# Patient Record
Sex: Female | Born: 2000 | Race: White | Hispanic: No | Marital: Single | Smoking: Current some day smoker
Health system: Southern US, Community
[De-identification: ages and names within clinical notes are randomized; demographics above are authoritative.]

## PROBLEM LIST (undated history)

## (undated) DIAGNOSIS — Q2730 Arteriovenous malformation, site unspecified: Secondary | ICD-10-CM

## (undated) HISTORY — PX: KIDNEY SURGERY: SHX687

---

## 2018-05-19 ENCOUNTER — Other Ambulatory Visit: Payer: Self-pay

## 2018-05-19 ENCOUNTER — Emergency Department (HOSPITAL_COMMUNITY)
Admission: EM | Admit: 2018-05-19 | Discharge: 2018-05-19 | Disposition: A | Discharge status: Home / Self Care | Payer: Self-pay | Attending: Emergency Medicine | Admitting: Emergency Medicine

## 2018-05-19 ENCOUNTER — Emergency Department (HOSPITAL_COMMUNITY): Payer: Self-pay

## 2018-05-19 ENCOUNTER — Encounter (HOSPITAL_COMMUNITY): Payer: Self-pay | Admitting: Emergency Medicine

## 2018-05-19 DIAGNOSIS — R0789 Other chest pain: Secondary | ICD-10-CM | POA: Insufficient documentation

## 2018-05-19 DIAGNOSIS — F17200 Nicotine dependence, unspecified, uncomplicated: Secondary | ICD-10-CM | POA: Insufficient documentation

## 2018-05-19 HISTORY — DX: Arteriovenous malformation, site unspecified: Q27.30

## 2018-05-19 LAB — CBC
HCT: 42.3 % (ref 36.0–46.0)
Hemoglobin: 13.7 g/dL (ref 12.0–15.0)
MCH: 29.3 pg (ref 26.0–34.0)
MCHC: 32.4 g/dL (ref 30.0–36.0)
MCV: 90.6 fL (ref 80.0–100.0)
PLATELETS: 249 10*3/uL (ref 150–400)
RBC: 4.67 MIL/uL (ref 3.87–5.11)
RDW: 12.3 % (ref 11.5–15.5)
WBC: 5.3 10*3/uL (ref 4.0–10.5)
nRBC: 0 % (ref 0.0–0.2)

## 2018-05-19 LAB — BASIC METABOLIC PANEL
ANION GAP: 7 (ref 5–15)
BUN: 12 mg/dL (ref 6–20)
CO2: 26 mmol/L (ref 22–32)
Calcium: 9.5 mg/dL (ref 8.9–10.3)
Chloride: 105 mmol/L (ref 98–111)
Creatinine, Ser: 0.81 mg/dL (ref 0.44–1.00)
GFR calc Af Amer: >60 mL/min (ref >60)
GLUCOSE: 88 mg/dL (ref 70–99)
Potassium: 3.9 mmol/L (ref 3.5–5.1)
Sodium: 138 mmol/L (ref 135–145)

## 2018-05-19 LAB — I-STAT TROPONIN, ED: TROPONIN I, POC: 0 ng/mL (ref 0.00–0.08)

## 2018-05-19 LAB — I-STAT BETA HCG BLOOD, ED (MC, WL, AP ONLY): I-stat hCG, quantitative: <5 m[IU]/mL (ref <5)

## 2018-05-19 MED ORDER — SODIUM CHLORIDE 0.9% FLUSH: 3.0000 mL | Freq: Once | INTRAVENOUS | Status: DC

## 2018-05-19 NOTE — ED Triage Notes (Signed)
Pt presents with multiple complaints. C/o sudden onset chest pain that started last night with shortness of breath that started today. Reports left knee pain and feeling like her knee will "give out". C/o white spots on throat, no pain in throat.

## 2018-05-19 NOTE — ED Notes (Signed)
Pt just came in and states that she had checked in and left to go to car.  Pt undischarged.

## 2018-05-19 NOTE — ED Notes (Signed)
ED Provider at bedside. 

## 2018-05-19 NOTE — ED Notes (Signed)
Pt reports that she has been under a lot of stress. States that her mom recently lost her job and she has been working to support both of them. Denies SI/HI.

## 2018-05-19 NOTE — ED Notes (Signed)
Pt back from X-ray.  

## 2018-05-19 NOTE — ED Provider Notes (Signed)
[facility] EMERGENCY DEPARTMENT Provider Note   CSN: [id number] Arrival date & time: 05/19/18  1505     History   Chief Complaint Chief Complaint  Patient presents with  . Chest Pain  . Shortness of Breath    HPI Brittney Day is a 18 y.o. female.  Patient is a 18 year old female who presents with chest pain.  She said that she had a sharp pain in her chest last night but that went away and now she has a dull aching in the center of her chest.  She feels like it is a little uncomfortable to take a deep breath but the pain is not really worse when she takes a deep breath.  She feels a little short of breath at times.  She has no history of heart problems.  No history of similar pain.  She states she does drink a lot of red bowls at work and sometimes she feels like her heart is racing.  She denies any dizziness.  She does report some pain to the back of her left knee.  She denies any leg swelling.  She feels like the knee slides in and out of joint from time to time but denies any known swelling of the knee or the leg.  She denies any tobacco use.  She does not take oral contraceptives.  No recent immobilization.  No history of DVT.     Past Medical History:  Diagnosis Date  . AV malformation, peripheral, congenital     There are no active problems to display for this patient.    The histories are not reviewed yet. Please review them in the "History" navigator section and refresh this SmartLink.   OB History   No obstetric history on file.      Home Medications    Prior to Admission medications   Not on File    Family History No family history on file.  Social History Social History   Tobacco Use  . Smoking status: Current Some Day Smoker  . Smokeless tobacco: Never Used  Substance Use Topics  . Alcohol use: Never    Frequency: Never  . Drug use: Never     Allergies   Patient has no known allergies.   Review of  Systems Review of Systems  Constitutional: Negative for chills, diaphoresis, fatigue and fever.  HENT: Negative for congestion, rhinorrhea and sneezing.   Eyes: Negative.   Respiratory: Positive for shortness of breath. Negative for cough and chest tightness.   Cardiovascular: Positive for chest pain. Negative for leg swelling.  Gastrointestinal: Negative for abdominal pain, blood in stool, diarrhea, nausea and vomiting.  Genitourinary: Negative for difficulty urinating, flank pain, frequency and hematuria.  Musculoskeletal: Positive for arthralgias. Negative for back pain.  Skin: Negative for rash.  Neurological: Negative for dizziness, speech difficulty, weakness, numbness and headaches.     Physical Exam Updated Vital Signs BP 138/88   Pulse 99   Temp 98.9 F (37.2 C) (Oral)   Resp 16   Ht 5\' 4"  (1.626 m)   Wt 54.4 kg   LMP 05/15/2018   SpO2 100%   BMI 20.60 kg/m   Physical Exam Constitutional:      Appearance: She is well-developed.  HENT:     Head: Normocephalic and atraumatic.  Eyes:     Pupils: Pupils are equal, round, and reactive to light.  Neck:     Musculoskeletal: Normal range of motion and neck supple.  Cardiovascular:  Rate and Rhythm: Normal rate and regular rhythm.     Heart sounds: Normal heart sounds.  Pulmonary:     Effort: Pulmonary effort is normal. No respiratory distress.     Breath sounds: Normal breath sounds. No wheezing or rales.  Chest:     Chest wall: No tenderness.  Abdominal:     General: Bowel sounds are normal.     Palpations: Abdomen is soft.     Tenderness: There is no abdominal tenderness. There is no guarding or rebound.  Musculoskeletal: Normal range of motion.     Comments: Patient has a tiny punctate abrasion to the posterior aspect of her left knee.  There is no swelling to the leg or the knee.  No gross instability.  No bony tenderness.  She does have a little discomfort on palpation of the posterior knee and upper calf.   Lymphadenopathy:     Cervical: No cervical adenopathy.  Skin:    General: Skin is warm and dry.     Findings: No rash.  Neurological:     Mental Status: She is alert and oriented to person, place, and time.      ED Treatments / Results  Labs (all labs ordered are listed, but only abnormal results are displayed) Labs Reviewed  BASIC METABOLIC PANEL  CBC  I-STAT TROPONIN, ED  I-STAT BETA HCG BLOOD, ED ([facility], [facility], [facility] ONLY)    EKG EKG Interpretation  Date/Time:  Sunday May 19 2018 15:57:31 EST Ventricular Rate:  84 PR Interval:  132 QRS Duration: 84 QT Interval:  354 QTC Calculation: 418 R Axis:   76 Text Interpretation:  Normal sinus rhythm with sinus arrhythmia Normal ECG No old tracing to compare Confirmed by Rolan BuccoBelfi, Jillianne Gamino [phone]([id number]) on 05/19/2018 4:25:19 PM   Radiology Dg Chest 2 View  Result Date: 05/19/2018 CLINICAL DATA:  C/o sudden onset chest pain that started last night with shortness of breath that started today. Smoker, and anxiety EXAM: CHEST - 2 VIEW COMPARISON:  None. FINDINGS: Heart size and mediastinal contours are within normal limits. Lungs are clear. Lung volumes are normal. No pleural effusion or pneumothorax seen. Dextroscoliosis of the thoracolumbar spine, mild to moderate in degree. No acute appearing osseous abnormality. IMPRESSION: No active cardiopulmonary disease. No evidence of pneumonia or pulmonary edema. Electronically Signed   By: Bary RichardStan  Maynard M.D.   On: 05/19/2018 17:07    Procedures Procedures (including critical care time)  Medications Ordered in ED Medications  sodium chloride flush (NS) 0.9 % injection 3 mL (3 mLs Intravenous Not Given 05/19/18 1655)     Initial Impression / Assessment and Plan / ED Course  I have reviewed the triage vital signs and the nursing notes.  Pertinent labs & imaging results that were available during my care of the patient were reviewed by me and considered in my medical decision making (see chart for  details).     Patient presents with chest pain.  Her EKG does not show any ischemic changes.  She had labs drawn in triage which show no acute abnormalities.  Her troponin is negative.  Chest x-ray does not show any acute abnormality.  She does have some posterior calf pain and although I feel she has a low likelihood of having a DVT/PE, I did discuss doing a d-dimer with her.  She states that at this point she cannot stay any longer for any further assessment and has to leave to go to work.  I discussed risk of leaving.  Return precautions were given.  Final Clinical Impressions(s) / ED Diagnoses   Final diagnoses:  Atypical chest pain    ED Discharge Orders    None       Rolan Bucco, MD 05/19/18 1727

## 2018-05-19 NOTE — ED Notes (Signed)
Patient transported to X-ray 

## 2018-05-19 NOTE — ED Notes (Signed)
Pt stating she must leave for work, unable to stay for discharge vitals. Patient verbalizes understanding of discharge instructions. Opportunity for questioning and answers were provided. Armband removed by staff, pt discharged from ED. Ambulatory to lob by.

## 2019-05-07 ENCOUNTER — Other Ambulatory Visit: Payer: Self-pay

## 2019-05-07 ENCOUNTER — Encounter (HOSPITAL_COMMUNITY): Payer: Self-pay | Admitting: Emergency Medicine

## 2019-05-07 ENCOUNTER — Emergency Department (HOSPITAL_COMMUNITY)
Admission: EM | Admit: 2019-05-07 | Discharge: 2019-05-07 | Disposition: A | Discharge status: Home / Self Care | Payer: PRIVATE HEALTH INSURANCE | Attending: Emergency Medicine | Admitting: Emergency Medicine

## 2019-05-07 DIAGNOSIS — R300 Dysuria: Secondary | ICD-10-CM | POA: Diagnosis present

## 2019-05-07 DIAGNOSIS — N760 Acute vaginitis: Secondary | ICD-10-CM | POA: Insufficient documentation

## 2019-05-07 DIAGNOSIS — B9689 Other specified bacterial agents as the cause of diseases classified elsewhere: Secondary | ICD-10-CM | POA: Diagnosis not present

## 2019-05-07 DIAGNOSIS — N3001 Acute cystitis with hematuria: Secondary | ICD-10-CM | POA: Diagnosis not present

## 2019-05-07 DIAGNOSIS — F17210 Nicotine dependence, cigarettes, uncomplicated: Secondary | ICD-10-CM | POA: Diagnosis not present

## 2019-05-07 LAB — URINALYSIS, ROUTINE W REFLEX MICROSCOPIC
Bilirubin Urine: NEGATIVE
Glucose, UA: NEGATIVE mg/dL
Hgb urine dipstick: NEGATIVE
Ketones, ur: 20 mg/dL — AB
Nitrite: NEGATIVE
Protein, ur: NEGATIVE mg/dL
Specific Gravity, Urine: 1.025 (ref 1.005–1.030)
pH: 5 (ref 5.0–8.0)

## 2019-05-07 LAB — WET PREP, GENITAL
Sperm: NONE SEEN
Trich, Wet Prep: NONE SEEN
Yeast Wet Prep HPF POC: NONE SEEN

## 2019-05-07 LAB — PREGNANCY, URINE: Preg Test, Ur: NEGATIVE

## 2019-05-07 MED ORDER — CEPHALEXIN 500 MG PO CAPS: 500.0000 mg | ORAL_CAPSULE | Freq: Two times a day (BID) | ORAL | 0 refills | Status: AC

## 2019-05-07 MED ORDER — METRONIDAZOLE 500 MG PO TABS: 500.0000 mg | ORAL_TABLET | Freq: Two times a day (BID) | ORAL | 0 refills | Status: AC

## 2019-05-07 MED ORDER — CEPHALEXIN 500 MG PO CAPS
500.0000 mg | ORAL_CAPSULE | Freq: Once | ORAL | Status: AC
  Administered 2019-05-07: 500 mg via ORAL
  Filled 2019-05-07: qty 1

## 2019-05-07 MED ORDER — METRONIDAZOLE 500 MG PO TABS
500.0000 mg | ORAL_TABLET | Freq: Once | ORAL | Status: AC
  Administered 2019-05-07: 21:00:00 500 mg via ORAL
  Filled 2019-05-07: qty 1

## 2019-05-07 NOTE — Discharge Instructions (Addendum)
You were seen today for vaginal discharge and urinary symptoms.  It appears you have a urinary tract infection as well as vaginosis.  Please see the attached pamphlet for further information.  We have prescribed you 2 medications for this.  If you have any new or worsening symptoms please return to the emergency department.  Your gonorrhea and Chlamydia tests are pending.  Please avoid sexual intercourse until you know your results.  If your results are positive you will need to return for further treatment. Thank you for allowing me to care for you today. Take your medications as instructed.

## 2019-05-07 NOTE — ED Provider Notes (Signed)
[facility] EMERGENCY DEPARTMENT Provider Note   CSN: [id number] Arrival date & time: 05/07/19  1956     History Chief Complaint  Patient presents with  . Dysuria    Brittney Day is a 19 y.o. female.  Patient is a 19 year old female with no contributory past medical history presenting to the emergency department for dysuria and vaginal discharge for the last 2 to 3 days.  Patient reports that she has had a monogamous relationship with her boyfriend.  Reports that she has been having burning with urination and foul-smelling urine as well as a new vaginal discharge.  She denies any fever, chills, nausea, vomiting, diarrhea.  Reports she had her last menstrual cycle which is 2 weeks ago and normal.        Past Medical History:  Diagnosis Date  . AV malformation, peripheral, congenital     There are no problems to display for this patient.   Past Surgical History:  Procedure Laterality Date  . KIDNEY SURGERY       OB History   No obstetric history on file.     No family history on file.  Social History   Tobacco Use  . Smoking status: Current Some Day Smoker  . Smokeless tobacco: Never Used  Substance Use Topics  . Alcohol use: Never  . Drug use: Never    Home Medications Prior to Admission medications   Medication Sig Start Date End Date Taking? Authorizing Provider  cephALEXin (KEFLEX) 500 MG capsule Take 1 capsule (500 mg total) by mouth 2 (two) times daily for 7 days. 05/07/19 05/14/19  Arlyn Dunning, PA-C  metroNIDAZOLE (FLAGYL) 500 MG tablet Take 1 tablet (500 mg total) by mouth 2 (two) times daily. 05/07/19   Arlyn Dunning, PA-C    Allergies    Patient has no known allergies.  Review of Systems   Review of Systems  Constitutional: Negative for chills and fever.  HENT: Negative for congestion and sore throat.   Respiratory: Negative for cough and shortness of breath.   Gastrointestinal: Negative for abdominal pain, nausea and vomiting.   Genitourinary: Positive for dysuria, urgency and vaginal discharge. Negative for flank pain, hematuria, pelvic pain, vaginal bleeding and vaginal pain.  Musculoskeletal: Negative for back pain and myalgias.  Neurological: Negative for dizziness.    Physical Exam Updated Vital Signs BP 131/82 (BP Location: Right Arm)   Pulse 83   Temp 98.4 F (36.9 C) (Oral)   Resp 18   Ht 5\' 4"  (1.626 m)   Wt 56.7 kg   LMP 04/20/2019 (Approximate)   SpO2 99%   BMI 21.46 kg/m   Physical Exam Vitals and nursing note reviewed. Exam conducted with a chaperone present.  Constitutional:      General: She is not in acute distress.    Appearance: Normal appearance. She is not ill-appearing, toxic-appearing or diaphoretic.  HENT:     Head: Normocephalic.     Mouth/Throat:     Mouth: Mucous membranes are moist.  Eyes:     Conjunctiva/sclera: Conjunctivae normal.  Pulmonary:     Effort: Pulmonary effort is normal.  Genitourinary:    General: Normal vulva.     Vagina: Vaginal discharge (white) present. No erythema, tenderness or bleeding.     Cervix: Normal.     Uterus: Normal.      Adnexa:        Right: No mass, tenderness or fullness.         Left: No mass,  tenderness or fullness.    Skin:    General: Skin is dry.  Neurological:     Mental Status: She is alert.  Psychiatric:        Mood and Affect: Mood normal.     ED Results / Procedures / Treatments   Labs (all labs ordered are listed, but only abnormal results are displayed) Labs Reviewed  WET PREP, GENITAL - Abnormal; Notable for the following components:      Result Value   Clue Cells Wet Prep HPF POC PRESENT (*)    WBC, Wet Prep HPF POC RARE (*)    All other components within normal limits  URINALYSIS, ROUTINE W REFLEX MICROSCOPIC - Abnormal; Notable for the following components:   APPearance HAZY (*)    Ketones, ur 20 (*)    Leukocytes,Ua MODERATE (*)    Bacteria, UA RARE (*)    All other components within normal limits   PREGNANCY, URINE  GC/CHLAMYDIA PROBE AMP ([place]) NOT AT [facility]    EKG None  Radiology No results found.  Procedures Procedures (including critical care time)  Medications Ordered in ED Medications  metroNIDAZOLE (FLAGYL) tablet 500 mg (has no administration in time range)  cephALEXin (KEFLEX) capsule 500 mg (has no administration in time range)    ED Course  I have reviewed the triage vital signs and the nursing notes.  Pertinent labs & imaging results that were available during my care of the patient were reviewed by me and considered in my medical decision making (see chart for details).  Clinical Course as of May 06 2117  Wed May 07, 2019  2043 Patient with vaginal discharge, odor and dysuria. No signs of PID on exam. No known STD exposure.    [KM]  2116 Patient with positive leukocytes, white blood cells, red blood cells and bacteria on urinalysis.  Patient does have clue cells present on wet prep as well.  Discussed this with the patient.  We will treat her with Keflex for UTI as well as Flagyl for vaginosis.  Discussed options for being treated for gonorrhea and chlamydia.  Patient declined at this time and will follow up with her lab results.  Advised on return precautions.   [KM]    Clinical Course User Index [KM] Jeral Pinch   MDM Rules/Calculators/A&P                      Based on review of vitals, medical screening exam, lab work and/or imaging, there does not appear to be an acute, emergent etiology for the patient's symptoms. Counseled pt on good return precautions and encouraged both PCP and ED follow-up as needed.  Prior to discharge, I also discussed incidental imaging findings with patient in detail and advised appropriate, recommended follow-up in detail.  Clinical Impression: 1. BV (bacterial vaginosis)   2. Acute cystitis with hematuria     Disposition: Discharge  Prior to providing a prescription for a controlled substance, I  independently reviewed the patient's recent prescription history on the [place] Controlled Substance Reporting System. The patient had no recent or regular prescriptions and was deemed appropriate for a brief, less than 3 day prescription of narcotic for acute analgesia.  This note was prepared with assistance of Conservation officer, historic buildings. Occasional wrong-word or sound-a-like substitutions may have occurred due to the inherent limitations of voice recognition software.  Final Clinical Impression(s) / ED Diagnoses Final diagnoses:  BV (bacterial vaginosis)  Acute cystitis with  hematuria    Rx / DC Orders ED Discharge Orders         Ordered    metroNIDAZOLE (FLAGYL) 500 MG tablet  2 times daily     05/07/19 2119    cephALEXin (KEFLEX) 500 MG capsule  2 times daily     05/07/19 2119           Kristine Royal 05/07/19 2119    Fredia Sorrow, MD 05/11/19 0830

## 2019-05-07 NOTE — ED Triage Notes (Signed)
Pt with c/o urinary frequency, burning with urination, urinary odor, vaginal odor, and vaginal discharge that it white in color x 3 days.

## 2019-05-09 LAB — GC/CHLAMYDIA PROBE AMP (~~LOC~~) NOT AT ARMC
Chlamydia: NEGATIVE
Neisseria Gonorrhea: NEGATIVE

## 2020-06-25 IMAGING — CR DG CHEST 2V
2 series · 2 of 2 positions shown · non-contrast
Comparison: None.

CLINICAL DATA: C/o sudden onset chest pain that started last night
with shortness of breath that started today. Smoker, and anxiety

EXAM:
CHEST - 2 VIEW

[chest pa]
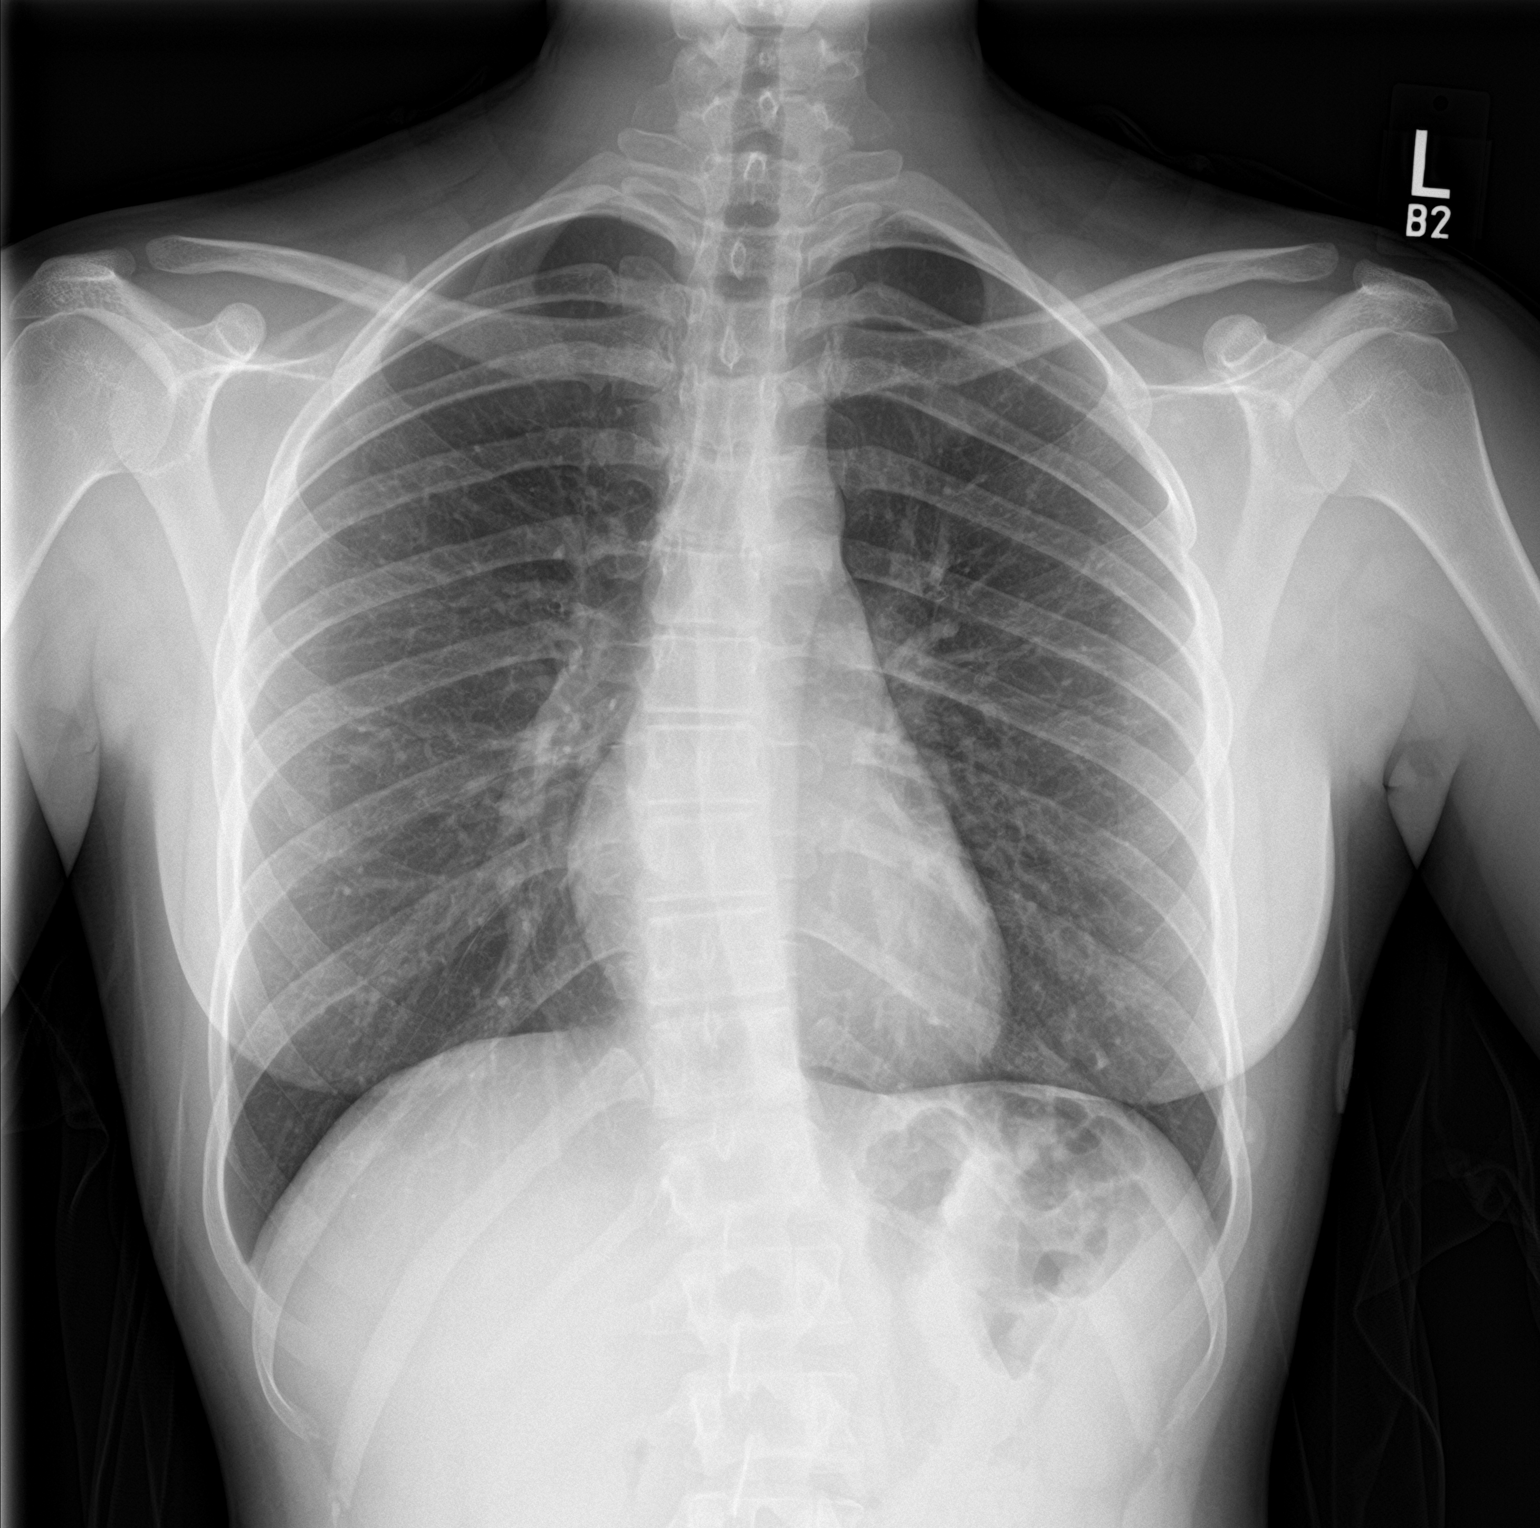

[chest lat]
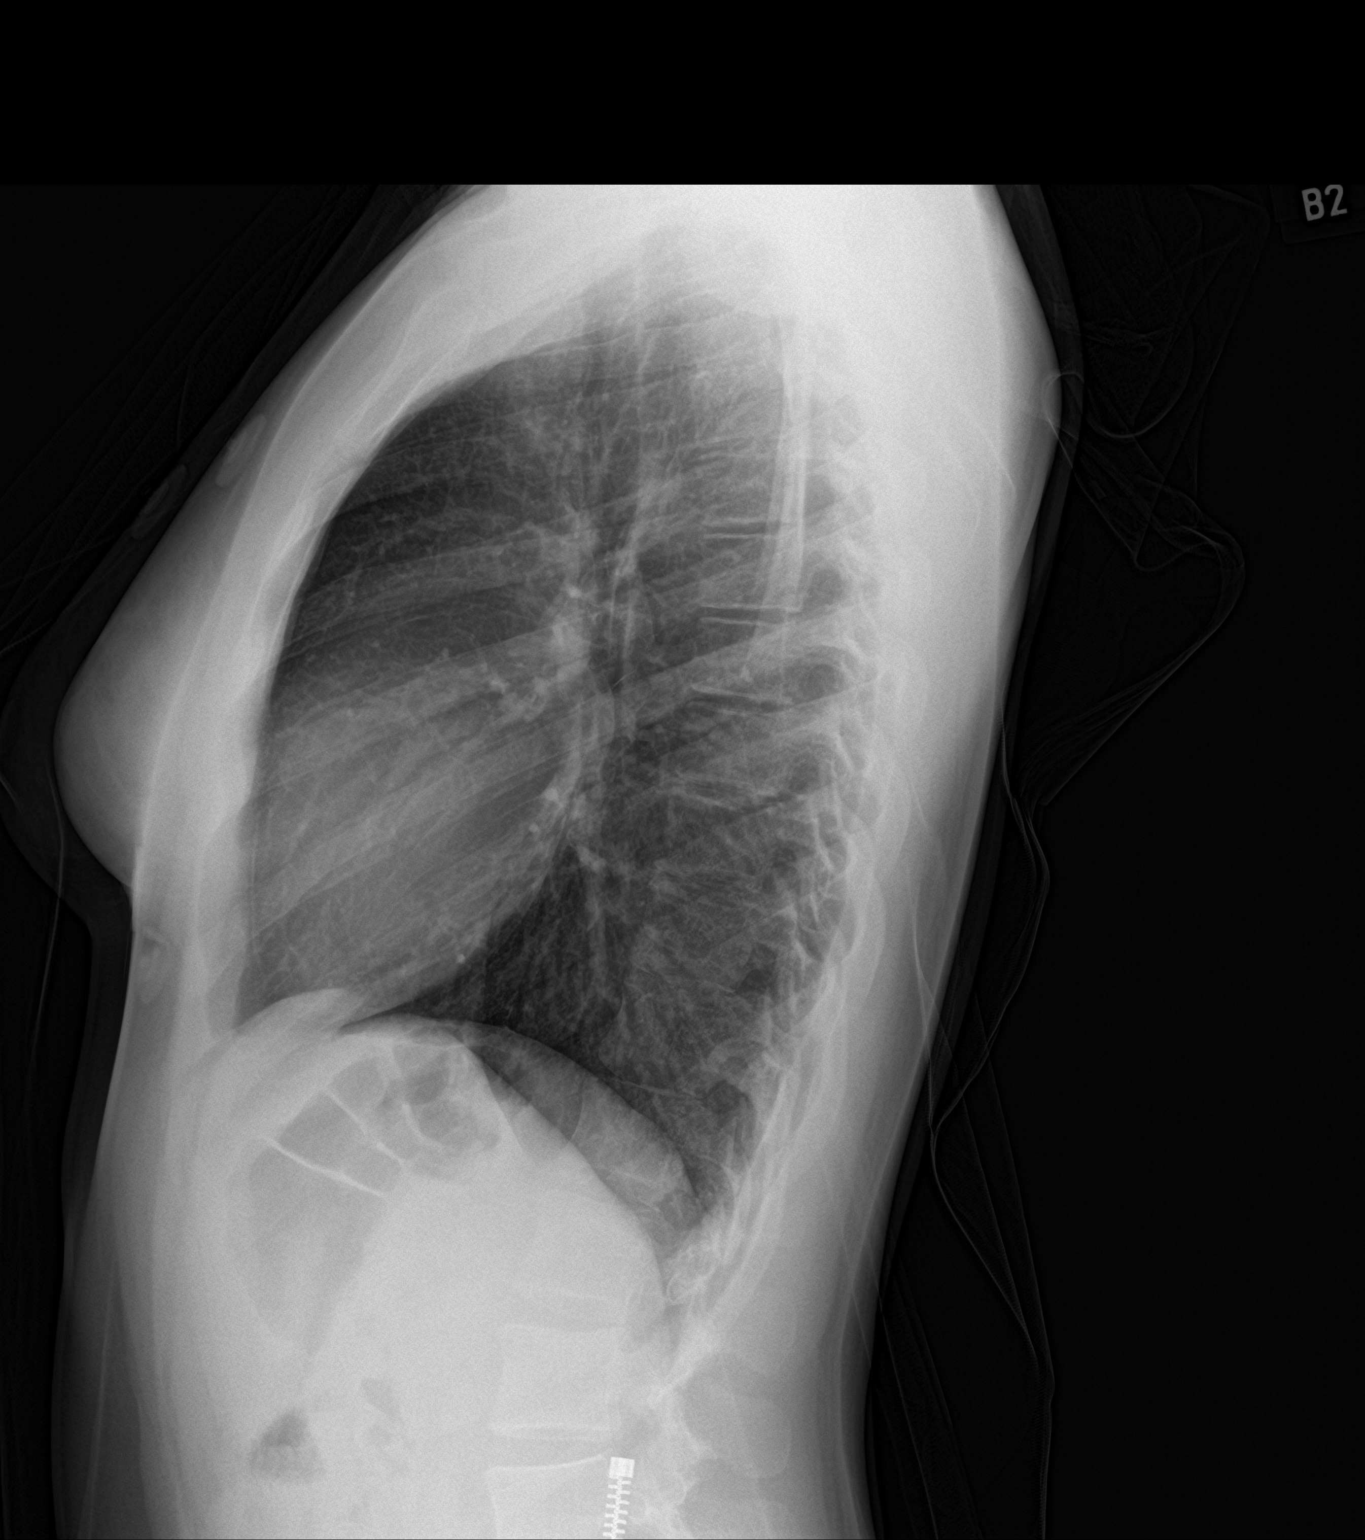

[2 of 2 positions shown; findings below may reference images not displayed]

FINDINGS: Heart size and mediastinal contours are within normal limits. Lungs
are clear. Lung volumes are normal. No pleural effusion or
pneumothorax seen. Dextroscoliosis of the thoracolumbar spine, mild
to moderate in degree. No acute appearing osseous abnormality.
IMPRESSION: No active cardiopulmonary disease. No evidence of pneumonia or
pulmonary edema.
